# Patient Record
Sex: Male | Born: 1990 | Race: Black or African American | Hispanic: No | Marital: Married | Smoking: Never smoker
Health system: Southern US, Community
[De-identification: ages and names within clinical notes are randomized; demographics above are authoritative.]

## PROBLEM LIST (undated history)

## (undated) DIAGNOSIS — J45909 Unspecified asthma, uncomplicated: Secondary | ICD-10-CM

---
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
## 2013-09-17 ENCOUNTER — Encounter (HOSPITAL_BASED_OUTPATIENT_CLINIC_OR_DEPARTMENT_OTHER): Payer: Self-pay | Admitting: Emergency Medicine

## 2013-09-17 ENCOUNTER — Emergency Department (HOSPITAL_BASED_OUTPATIENT_CLINIC_OR_DEPARTMENT_OTHER)
Admission: EM | Admit: 2013-09-17 | Discharge: 2013-09-17 | Disposition: A | Discharge status: Home / Self Care | Payer: BC Managed Care – PPO | Attending: Emergency Medicine | Admitting: Emergency Medicine

## 2013-09-17 DIAGNOSIS — S8990XA Unspecified injury of unspecified lower leg, initial encounter: Secondary | ICD-10-CM | POA: Insufficient documentation

## 2013-09-17 DIAGNOSIS — S99919A Unspecified injury of unspecified ankle, initial encounter: Principal | ICD-10-CM

## 2013-09-17 DIAGNOSIS — Y92410 Unspecified street and highway as the place of occurrence of the external cause: Secondary | ICD-10-CM | POA: Insufficient documentation

## 2013-09-17 DIAGNOSIS — S99929A Unspecified injury of unspecified foot, initial encounter: Principal | ICD-10-CM

## 2013-09-17 DIAGNOSIS — M25561 Pain in right knee: Secondary | ICD-10-CM

## 2013-09-17 DIAGNOSIS — Y9389 Activity, other specified: Secondary | ICD-10-CM | POA: Insufficient documentation

## 2013-09-17 NOTE — Discharge Instructions (Signed)
Activity as tolerated. Return sooner to your orthopedic doctor or the emergency room if you are unable to bear weight in your right leg your right knee locks in place or has give way weakness or you develop new weakness or numbness or right leg swelling or pain to the right calf or thigh or other concerns. You can try over-the-counter generic Tylenol or ibuprofen if needed.
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
## 2013-09-20 ENCOUNTER — Ambulatory Visit (INDEPENDENT_AMBULATORY_CARE_PROVIDER_SITE_OTHER): Payer: BC Managed Care – PPO

## 2013-09-20 ENCOUNTER — Encounter: Payer: Self-pay | Admitting: Sports Medicine

## 2013-09-20 ENCOUNTER — Ambulatory Visit (INDEPENDENT_AMBULATORY_CARE_PROVIDER_SITE_OTHER): Payer: BC Managed Care – PPO | Admitting: Sports Medicine

## 2013-09-20 VITALS — BP 128/82 | HR 60 | Ht 70.0 in | Wt 150.0 lb

## 2013-09-20 DIAGNOSIS — M25569 Pain in unspecified knee: Secondary | ICD-10-CM

## 2013-09-20 DIAGNOSIS — M25561 Pain in right knee: Secondary | ICD-10-CM | POA: Insufficient documentation

## 2013-09-20 MED ORDER — MELOXICAM 15 MG PO TABS: ORAL_TABLET | ORAL | Status: DC

## 2013-09-20 NOTE — Assessment & Plan Note (Signed)
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 We will start conservatively with formal PT, Mobic, patellar stabilizing brace. Return to see me in one month, consider a shot if no better.
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
## 2013-09-26 ENCOUNTER — Ambulatory Visit: Payer: BC Managed Care – PPO | Attending: Sports Medicine | Admitting: Physical Therapy

## 2013-10-03 ENCOUNTER — Encounter: Payer: BC Managed Care – PPO | Admitting: Rehabilitation

## 2013-10-06 ENCOUNTER — Encounter: Payer: BC Managed Care – PPO | Admitting: Physical Therapy

## 2013-10-18 ENCOUNTER — Ambulatory Visit: Payer: BC Managed Care – PPO | Admitting: Sports Medicine

## 2013-10-18 DIAGNOSIS — Z0289 Encounter for other administrative examinations: Secondary | ICD-10-CM

## 2013-11-12 ENCOUNTER — Emergency Department (HOSPITAL_BASED_OUTPATIENT_CLINIC_OR_DEPARTMENT_OTHER): Payer: BC Managed Care – PPO

## 2013-11-12 ENCOUNTER — Encounter (HOSPITAL_BASED_OUTPATIENT_CLINIC_OR_DEPARTMENT_OTHER): Payer: Self-pay | Admitting: Emergency Medicine

## 2013-11-12 ENCOUNTER — Emergency Department (HOSPITAL_BASED_OUTPATIENT_CLINIC_OR_DEPARTMENT_OTHER)
Admission: EM | Admit: 2013-11-12 | Discharge: 2013-11-12 | Disposition: A | Discharge status: Home / Self Care | Payer: BC Managed Care – PPO | Attending: Emergency Medicine | Admitting: Emergency Medicine

## 2013-11-12 DIAGNOSIS — Y92838 Other recreation area as the place of occurrence of the external cause: Secondary | ICD-10-CM

## 2013-11-12 DIAGNOSIS — S93609A Unspecified sprain of unspecified foot, initial encounter: Secondary | ICD-10-CM | POA: Insufficient documentation

## 2013-11-12 DIAGNOSIS — Y9239 Other specified sports and athletic area as the place of occurrence of the external cause: Secondary | ICD-10-CM | POA: Insufficient documentation

## 2013-11-12 DIAGNOSIS — Z79899 Other long term (current) drug therapy: Secondary | ICD-10-CM | POA: Insufficient documentation

## 2013-11-12 DIAGNOSIS — X500XXA Overexertion from strenuous movement or load, initial encounter: Secondary | ICD-10-CM | POA: Insufficient documentation

## 2013-11-12 DIAGNOSIS — S93501A Unspecified sprain of right great toe, initial encounter: Secondary | ICD-10-CM

## 2013-11-12 DIAGNOSIS — Y9367 Activity, basketball: Secondary | ICD-10-CM | POA: Insufficient documentation

## 2013-11-12 MED ORDER — NAPROXEN 500 MG PO TABS: 500.0000 mg | ORAL_TABLET | Freq: Two times a day (BID) | ORAL | Status: DC

## 2013-11-12 MED ORDER — HYDROCODONE-ACETAMINOPHEN 5-325 MG PO TABS: 2.0000 | ORAL_TABLET | ORAL | Status: DC | PRN

## 2013-11-12 NOTE — ED Provider Notes (Signed)
CSN: [id number]     Arrival date & time 11/12/13  2025 History   First MD Initiated Contact with Patient 11/12/13 2156     Chief Complaint  Patient presents with  . Toe Injury     (Consider location/radiation/quality/duration/timing/severity/associated sxs/prior Treatment) HPI Hector Mccann is a 23 y.o. male who presents to the ED with pain in his right great toe. He was playing basketball earlier and and came down on his toe and felt a pop. He complains of pain and swelling. The pain is moderate he has taken nothing for pain.   History reviewed. No pertinent past medical history. History reviewed. No pertinent past surgical history. No family history on file. History  Substance Use Topics  . Smoking status: Never Smoker   . Smokeless tobacco: Not on file  . Alcohol Use: No    Review of Systems Negative except as stated in HPI   Allergies  Review of patient's allergies indicates no known allergies.  Home Medications   Current Outpatient Rx  Name  Route  Sig  Dispense  Refill  . meloxicam (MOBIC) 15 MG tablet      One tab PO qAM with breakfast for 2 weeks, then daily prn pain.   30 tablet   3    BP 130/81  Temp(Src) 97.5 F (36.4 C) (Oral)  Resp 18  SpO2 100% Physical Exam  Nursing note and vitals reviewed. Constitutional: He is oriented to person, place, and time. He appears well-developed and well-nourished. No distress.  HENT:  Head: Normocephalic.  Eyes: EOM are normal.  Neck: Neck supple.  Pulmonary/Chest: Effort normal.  Abdominal: There is tenderness.  Musculoskeletal: Normal range of motion.       Right foot: He exhibits tenderness. He exhibits normal range of motion, no deformity and no laceration. Swelling: minimal.       Feet:  Tender with palpation and range of motion of the right great toe. Pedal pulse strong, adequate circulation, good touch sensation.  Neurological: He is alert and oriented to person, place, and time. No cranial nerve  deficit.  Skin: Skin is warm and dry.  Psychiatric: He has a normal mood and affect. His behavior is normal.    ED Course  Procedures (including critical care time) Labs Review Labs Reviewed - No data to display Imaging Review Dg Toe Great Right  11/12/2013   CLINICAL DATA:  Basketball injury. Fall great toe pop. Now with pain and swelling.  EXAM: RIGHT GREAT TOE  COMPARISON:  None.  FINDINGS: No fracture or dislocation. Small marginal spurs project from the first metatarsal head. IP joint is well preserved. Soft tissues are unremarkable.  IMPRESSION: No fracture or acute finding. Minor marginal spurring at the first metatarsophalangeal joint.   Electronically Signed   By: Amie Portlandavid  Ormond M.D.   On: 11/12/2013 21:25     MDM  23 y.o. male with right toe pain s/p injury. Buddy tape, post op shoe, ice, elevate, return as needed. Stable for discharge without neurovascular compromise.  Discussed with the patient and all questioned fully answered. He will return if any problems arise.    Medication List    STOP taking these medications       meloxicam 15 MG tablet  Commonly known as:  MOBIC      TAKE these medications       HYDROcodone-acetaminophen 5-325 MG per tablet  Commonly known as:  NORCO/VICODIN  Take 2 tablets by mouth every 4 (four) hours as needed.  naproxen 500 MG tablet  Commonly known as:  NAPROSYN  Take 1 tablet (500 mg total) by mouth 2 (two) times daily.           [place], [place] 11/13/13 [phone]

## 2013-11-12 NOTE — ED Notes (Signed)
Was playing basketball and felt his right great toe "pop" and now c/o pain and swelling.

## 2013-11-14 NOTE — ED Notes (Signed)
Pt presented to ED for a return to work note.

## 2013-11-15 NOTE — ED Provider Notes (Signed)
Medical screening examination/treatment/procedure(s) were performed by non-physician practitioner and as supervising physician I was immediately available for consultation/collaboration.   EKG Interpretation None        Shelda JakesScott W. Silviano Neuser, MD 11/15/13 [phone]

## 2014-11-29 ENCOUNTER — Emergency Department (HOSPITAL_COMMUNITY): Payer: Self-pay

## 2014-11-29 ENCOUNTER — Emergency Department (HOSPITAL_COMMUNITY)
Admission: EM | Admit: 2014-11-29 | Discharge: 2014-11-29 | Discharge status: Against Medical Advice | Payer: Self-pay | Attending: Emergency Medicine | Admitting: Emergency Medicine

## 2014-11-29 ENCOUNTER — Encounter (HOSPITAL_COMMUNITY): Payer: Self-pay | Admitting: Emergency Medicine

## 2014-11-29 DIAGNOSIS — R0603 Acute respiratory distress: Secondary | ICD-10-CM

## 2014-11-29 DIAGNOSIS — Z791 Long term (current) use of non-steroidal anti-inflammatories (NSAID): Secondary | ICD-10-CM | POA: Insufficient documentation

## 2014-11-29 DIAGNOSIS — J988 Other specified respiratory disorders: Secondary | ICD-10-CM | POA: Insufficient documentation

## 2014-11-29 DIAGNOSIS — J45901 Unspecified asthma with (acute) exacerbation: Secondary | ICD-10-CM

## 2014-11-29 HISTORY — DX: Unspecified asthma, uncomplicated: J45.909

## 2014-11-29 LAB — BASIC METABOLIC PANEL
Anion gap: 11 (ref 5–15)
BUN: 10 mg/dL (ref 6–23)
CALCIUM: 9.8 mg/dL (ref 8.4–10.5)
CO2: 28 mmol/L (ref 19–32)
Chloride: 101 mmol/L (ref 96–112)
Creatinine, Ser: 1.15 mg/dL (ref 0.50–1.35)
GFR calc non Af Amer: 88 mL/min — ABNORMAL LOW (ref >90)
Glucose, Bld: 93 mg/dL (ref 70–99)
POTASSIUM: 3.6 mmol/L (ref 3.5–5.1)
SODIUM: 140 mmol/L (ref 135–145)

## 2014-11-29 LAB — CBC WITH DIFFERENTIAL/PLATELET
Basophils Absolute: 0 10*3/uL (ref 0.0–0.1)
Basophils Relative: 0 % (ref 0–1)
Eosinophils Absolute: 0.5 10*3/uL (ref 0.0–0.7)
Eosinophils Relative: 5 % (ref 0–5)
HCT: 46.2 % (ref 39.0–52.0)
Hemoglobin: 15.7 g/dL (ref 13.0–17.0)
LYMPHS ABS: 2.4 10*3/uL (ref 0.7–4.0)
LYMPHS PCT: 25 % (ref 12–46)
MCH: 31.1 pg (ref 26.0–34.0)
MCHC: 34 g/dL (ref 30.0–36.0)
MCV: 91.5 fL (ref 78.0–100.0)
MONO ABS: 0.7 10*3/uL (ref 0.1–1.0)
Monocytes Relative: 7 % (ref 3–12)
NEUTROS ABS: 5.8 10*3/uL (ref 1.7–7.7)
NEUTROS PCT: 63 % (ref 43–77)
PLATELETS: 236 10*3/uL (ref 150–400)
RBC: 5.05 MIL/uL (ref 4.22–5.81)
RDW: 11.7 % (ref 11.5–15.5)
WBC: 9.4 10*3/uL (ref 4.0–10.5)

## 2014-11-29 MED ORDER — SODIUM CHLORIDE 0.9 % IV BOLUS (SEPSIS)
1000.0000 mL | Freq: Once | INTRAVENOUS | Status: AC
  Administered 2014-11-29: 1000 mL via INTRAVENOUS

## 2014-11-29 MED ORDER — MAGNESIUM SULFATE 2 GM/50ML IV SOLN
2.0000 g | Freq: Once | INTRAVENOUS | Status: AC
  Administered 2014-11-29: 2 g via INTRAVENOUS
  Filled 2014-11-29: qty 50

## 2014-11-29 MED ORDER — IPRATROPIUM-ALBUTEROL 0.5-2.5 (3) MG/3ML IN SOLN
RESPIRATORY_TRACT | Status: AC
  Filled 2014-11-29: qty 3

## 2014-11-29 MED ORDER — ALBUTEROL (5 MG/ML) CONTINUOUS INHALATION SOLN
10.0000 mg/h | INHALATION_SOLUTION | RESPIRATORY_TRACT | Status: DC
  Administered 2014-11-29: 10 mg/h via RESPIRATORY_TRACT
  Filled 2014-11-29: qty 20

## 2014-11-29 MED ORDER — PREDNISONE 20 MG PO TABS: 60.0000 mg | ORAL_TABLET | Freq: Every day | ORAL | Status: DC

## 2014-11-29 MED ORDER — PREDNISONE 20 MG PO TABS
60.0000 mg | ORAL_TABLET | Freq: Once | ORAL | Status: AC
  Administered 2014-11-29: 60 mg via ORAL
  Filled 2014-11-29: qty 3

## 2014-11-29 MED ORDER — IPRATROPIUM BROMIDE 0.02 % IN SOLN
1.0000 mg | Freq: Once | RESPIRATORY_TRACT | Status: AC
  Administered 2014-11-29: 1 mg via RESPIRATORY_TRACT
  Filled 2014-11-29: qty 5

## 2014-11-29 MED ORDER — IPRATROPIUM-ALBUTEROL 0.5-2.5 (3) MG/3ML IN SOLN
3.0000 mL | Freq: Once | RESPIRATORY_TRACT | Status: AC
  Administered 2014-11-29: 3 mL via RESPIRATORY_TRACT

## 2014-11-29 MED ORDER — ALBUTEROL SULFATE HFA 108 (90 BASE) MCG/ACT IN AERS
2.0000 | INHALATION_SPRAY | Freq: Once | RESPIRATORY_TRACT | Status: AC
  Administered 2014-11-29: 2 via RESPIRATORY_TRACT
  Filled 2014-11-29: qty 6.7

## 2014-11-29 NOTE — ED Notes (Signed)
Patient asking why his heart is racing.  Explained to him that the breathing treatment had a steroid in it and that can cause your heart to race.

## 2014-11-29 NOTE — ED Notes (Signed)
Phlebotomy at the bedside  

## 2014-11-29 NOTE — ED Notes (Signed)
Patient with acute asthma exacerbation starting today. States life long history, but doesn't usually have trouble with it. Denies inhaler use, states "I lost them". Wheezing audible from bedside.

## 2014-11-29 NOTE — Discharge Instructions (Signed)
Asthma Attack Prevention Hector Mccann, you have decided to leave against medical advice while your oxygen level is low.  Come back to the ED immediately to have your treatment finished.  Use your inhaler every 4 hours for the next 2 days.  Follow up with a primary doctor within 3 days for care of your asthma.  Thank you. Although there is no way to prevent asthma from starting, you can take steps to control the disease and reduce its symptoms. Learn about your asthma and how to control it. Take an active role to control your asthma by working with your health care provider to create and follow an asthma action plan. An asthma action plan guides you in:  Taking your medicines properly.  Avoiding things that set off your asthma or make your asthma worse (asthma triggers).  Tracking your level of asthma control.  Responding to worsening asthma.  Seeking emergency care when needed. To track your asthma, keep records of your symptoms, check your peak flow number using a handheld device that shows how well air moves out of your lungs (peak flow meter), and get regular asthma checkups.  WHAT ARE SOME WAYS TO PREVENT AN ASTHMA ATTACK?  Take medicines as directed by your health care provider.  Keep track of your asthma symptoms and level of control.  With your health care provider, write a detailed plan for taking medicines and managing an asthma attack. Then be sure to follow your action plan. Asthma is an ongoing condition that needs regular monitoring and treatment.  Identify and avoid asthma triggers. Many outdoor allergens and irritants (such as pollen, mold, cold air, and air pollution) can trigger asthma attacks. Find out what your asthma triggers are and take steps to avoid them.  Monitor your breathing. Learn to recognize warning signs of an attack, such as coughing, wheezing, or shortness of breath. Your lung function may decrease before you notice any signs or symptoms, so regularly measure  and record your peak airflow with a home peak flow meter.  Identify and treat attacks early. If you act quickly, you are less likely to have a severe attack. You will also need less medicine to control your symptoms. When your peak flow measurements decrease and alert you to an upcoming attack, take your medicine as instructed and immediately stop any activity that may have triggered the attack. If your symptoms do not improve, get medical help.  Pay attention to increasing quick-relief inhaler use. If you find yourself relying on your quick-relief inhaler, your asthma is not under control. See your health care provider about adjusting your treatment. WHAT CAN MAKE MY SYMPTOMS WORSE? A number of common things can set off or make your asthma symptoms worse and cause temporary increased inflammation of your airways. Keep track of your asthma symptoms for several weeks, detailing all the environmental and emotional factors that are linked with your asthma. When you have an asthma attack, go back to your asthma diary to see which factor, or combination of factors, might have contributed to it. Once you know what these factors are, you can take steps to control many of them. If you have allergies and asthma, it is important to take asthma prevention steps at home. Minimizing contact with the substance to which you are allergic will help prevent an asthma attack. Some triggers and ways to avoid these triggers are: Animal Dander:  Some people are allergic to the flakes of skin or dried saliva from animals with fur or feathers.  There is no such thing as a hypoallergenic dog or cat breed. All dogs or cats can cause allergies, even if they don't shed.  Keep these pets out of your home.  If you are not able to keep a pet outdoors, keep the pet out of your bedroom and other sleeping areas at all times, and keep the door closed.  Remove carpets and furniture covered with cloth from your home. If that is not  possible, keep the pet away from fabric-covered furniture and carpets. Dust Mites: Many people with asthma are allergic to dust mites. Dust mites are tiny bugs that are found in every home in mattresses, pillows, carpets, fabric-covered furniture, bedcovers, clothes, stuffed toys, and other fabric-covered items.   Cover your mattress in a special dust-proof cover.  Cover your pillow in a special dust-proof cover, or wash the pillow each week in hot water. Water must be hotter than 130 F (54.4 C) to kill dust mites. Cold or warm water used with detergent and bleach can also be effective.  Wash the sheets and blankets on your bed each week in hot water.  Try not to sleep or lie on cloth-covered cushions.  Call ahead when traveling and ask for a smoke-free hotel room. Bring your own bedding and pillows in case the hotel only supplies feather pillows and down comforters, which may contain dust mites and cause asthma symptoms.  Remove carpets from your bedroom and those laid on concrete, if you can.  Keep stuffed toys out of the bed, or wash the toys weekly in hot water or cooler water with detergent and bleach. Cockroaches: Many people with asthma are allergic to the droppings and remains of cockroaches.   Keep food and garbage in closed containers. Never leave food out.  Use poison baits, traps, powders, gels, or paste (for example, boric acid).  If a spray is used to kill cockroaches, stay out of the room until the odor goes away. Indoor Mold:  Fix leaky faucets, pipes, or other sources of water that have mold around them.  Clean floors and moldy surfaces with a fungicide or diluted bleach.  Avoid using humidifiers, vaporizers, or swamp coolers. These can spread molds through the air. Pollen and Outdoor Mold:  When pollen or mold spore counts are high, try to keep your windows closed.  Stay indoors with windows closed from late morning to afternoon. Pollen and some mold spore  counts are highest at that time.  Ask your health care provider whether you need to take anti-inflammatory medicine or increase your dose of the medicine before your allergy season starts. Other Irritants to Avoid:  Tobacco smoke is an irritant. If you smoke, ask your health care provider how you can quit. Ask family members to quit smoking, too. Do not allow smoking in your home or car.  If possible, do not use a wood-burning stove, kerosene heater, or fireplace. Minimize exposure to all sources of smoke, including incense, candles, fires, and fireworks.  Try to stay away from strong odors and sprays, such as perfume, talcum powder, hair spray, and paints.  Decrease humidity in your home and use an indoor air cleaning device. Reduce indoor humidity to below 60%. Dehumidifiers or central air conditioners can do this.  Decrease house dust exposure by changing furnace and air cooler filters frequently.  Try to have someone else vacuum for you once or twice a week. Stay out of rooms while they are being vacuumed and for a short while afterward.  If you vacuum, use a dust mask from a hardware store, a double-layered or microfilter vacuum cleaner bag, or a vacuum cleaner with a HEPA filter.  Sulfites in foods and beverages can be irritants. Do not drink beer or wine or eat dried fruit, processed potatoes, or shrimp if they cause asthma symptoms.  Cold air can trigger an asthma attack. Cover your nose and mouth with a scarf on cold or windy days.  Several health conditions can make asthma more difficult to manage, including a runny nose, sinus infections, reflux disease, psychological stress, and sleep apnea. Work with your health care provider to manage these conditions.  Avoid close contact with people who have a respiratory infection such as a cold or the flu, since your asthma symptoms may get worse if you catch the infection. Wash your hands thoroughly after touching items that may have been  handled by people with a respiratory infection.  Get a flu shot every year to protect against the flu virus, which often makes asthma worse for days or weeks. Also get a pneumonia shot if you have not previously had one. Unlike the flu shot, the pneumonia shot does not need to be given yearly. Medicines:  Talk to your health care provider about whether it is safe for you to take aspirin or non-steroidal anti-inflammatory medicines (NSAIDs). In a small number of people with asthma, aspirin and NSAIDs can cause asthma attacks. These medicines must be avoided by people who have known aspirin-sensitive asthma. It is important that people with aspirin-sensitive asthma read labels of all over-the-counter medicines used to treat pain, colds, coughs, and fever.  Beta-blockers and ACE inhibitors are other medicines you should discuss with your health care provider. HOW CAN I FIND OUT WHAT I AM ALLERGIC TO? Ask your asthma health care provider about allergy skin testing or blood testing (the RAST test) to identify the allergens to which you are sensitive. If you are found to have allergies, the most important thing to do is to try to avoid exposure to any allergens that you are sensitive to as much as possible. Other treatments for allergies, such as medicines and allergy shots (immunotherapy) are available.  CAN I EXERCISE? Follow your health care provider's advice regarding asthma treatment before exercising. It is important to maintain a regular exercise program, but vigorous exercise or exercise in cold, humid, or dry environments can cause asthma attacks, especially for those people who have exercise-induced asthma. Document Released: 08/12/2009 Document Revised: 08/29/2013 Document Reviewed: 03/01/2013 [facility] Patient Information 2015 [place], [place]. This information is not intended to replace advice given to you by your health care provider. Make sure you discuss any questions you have with your health  care provider.

## 2014-11-29 NOTE — ED Notes (Signed)
Pt states that he does not want to stay in the hospital, aware of risk and benefits, wheezing noted in right lower lobe, pt aware he will be leaving against medical advice.

## 2014-11-29 NOTE — ED Notes (Signed)
Pulse ox runs 93-98% on room air  Pulse runs 107-131bpm

## 2014-11-29 NOTE — ED Provider Notes (Signed)
CSN: [id number]     Arrival date & time 11/29/14  0121 History   First MD Initiated Contact with Patient 11/29/14 0126     This chart was scribed for Hector CrumbleAdeleke Jameika Kinn, MD by Arlan OrganAshley Leger, ED Scribe. This patient was seen in room B18C/B18C and the patient's care was started 1:48 AM.   Chief Complaint  Patient presents with  . Asthma  . Wheezing   HPI  HPI Comments: Hector Mccann is a 24 y.o. male with a PMHx of Asthma who presents to the Emergency Department complaining of a possible asthma exacerbation onset today. Pt reports constant, ongoing, unchanged SOB, non productive cough, wheezing, and subjective fever. No prior treatments prior to arrival as pt states he lost his rescue inhaler some time ago. [name] denies any recent asthma attacks and is unaware of what may have triggered todays flare up. No known allergies to medications.   Past Medical History  Diagnosis Date  . Asthma    History reviewed. No pertinent past surgical history. History reviewed. No pertinent family history. History  Substance Use Topics  . Smoking status: Never Smoker   . Smokeless tobacco: Not on file  . Alcohol Use: No    Review of Systems  A complete 10 system review of systems was obtained and all systems are negative except as noted in the HPI and PMH.    Allergies  Review of patient's allergies indicates no known allergies.  Home Medications   Prior to Admission medications   Medication Sig Start Date End Date Taking? Authorizing Provider  HYDROcodone-acetaminophen (NORCO/VICODIN) 5-325 MG per tablet Take 2 tablets by mouth every 4 (four) hours as needed. 11/12/13   Hope Orlene OchM Neese, NP  naproxen (NAPROSYN) 500 MG tablet Take 1 tablet (500 mg total) by mouth 2 (two) times daily. 11/12/13   Hope Orlene OchM Neese, NP   Triage Vitals: BP 142/87 mmHg  Pulse 73  Temp(Src) 97.5 F (36.4 C) (Oral)  Resp 26  Ht 5\' 10"  (1.778 m)  Wt 150 lb (68.04 kg)  BMI 21.52 kg/m2  SpO2 92%   Physical Exam   Constitutional: He is oriented to person, place, and time. He appears well-developed and well-nourished.  HENT:  Head: Normocephalic and atraumatic.  Eyes: EOM are normal.  Neck: Normal range of motion.  Cardiovascular: Normal rate, regular rhythm, normal heart sounds and intact distal pulses.   Pulmonary/Chest: No respiratory distress. He has wheezes.  Increase work of breathing   Abdominal: Soft. He exhibits no distension. There is no tenderness.  Musculoskeletal: Normal range of motion.  Neurological: He is alert and oriented to person, place, and time.  Skin: Skin is warm and dry.  Psychiatric: He has a normal mood and affect. Judgment normal.  Nursing note and vitals reviewed.   ED Course  Procedures (including critical care time)  DIAGNOSTIC STUDIES: Oxygen Saturation is 92% on RA, low by my interpretation.    COORDINATION OF CARE: 1:50 AM-Discussed treatment plan with pt at bedside and pt agreed to plan.     Labs Review Labs Reviewed - No data to display  Imaging Review No results found.   EKG Interpretation None      MDM   Final diagnoses:  None   Patient presents for asthma exacerbation.  Patient given alb/ipratropium, prednisone, magnesium and IVF.  After treatment he continued to have O2 sat of 90% on RA. With wheezing.  Patient was advised to be admitted but he is refusing.  He is leaving  AMA.  Patient has decision making capacity at this time.  He will be DC home without completing his evaluation.    CRITICAL CARE Performed by: Hector Crumble   Total critical care time: resp distress  Critical care time was exclusive of separately billable procedures and treating other patients.  Critical care was necessary to treat or prevent imminent or life-threatening deterioration.  Critical care was time spent personally by me on the following activities: development of treatment plan with patient and/or surrogate as well as nursing, discussions with  consultants, evaluation of patient's response to treatment, examination of patient, obtaining history from patient or surrogate, ordering and performing treatments and interventions, ordering and review of laboratory studies, ordering and review of radiographic studies, pulse oximetry and re-evaluation of patient's condition.   I personally performed the services described in this documentation, which was scribed in my presence. The recorded information has been reviewed and is accurate.   Hector Crumble, MD 11/29/14 [phone]

## 2014-11-29 NOTE — ED Notes (Signed)
Dr. Oni at the bedside.  

## 2015-05-11 IMAGING — DX DG CHEST 2V
2 series · 2 of 2 positions shown · non-contrast
Comparison: None.

CLINICAL DATA: Acute onset of shortness of breath. Initial
encounter.

EXAM:
CHEST  2 VIEW

[chest pa]
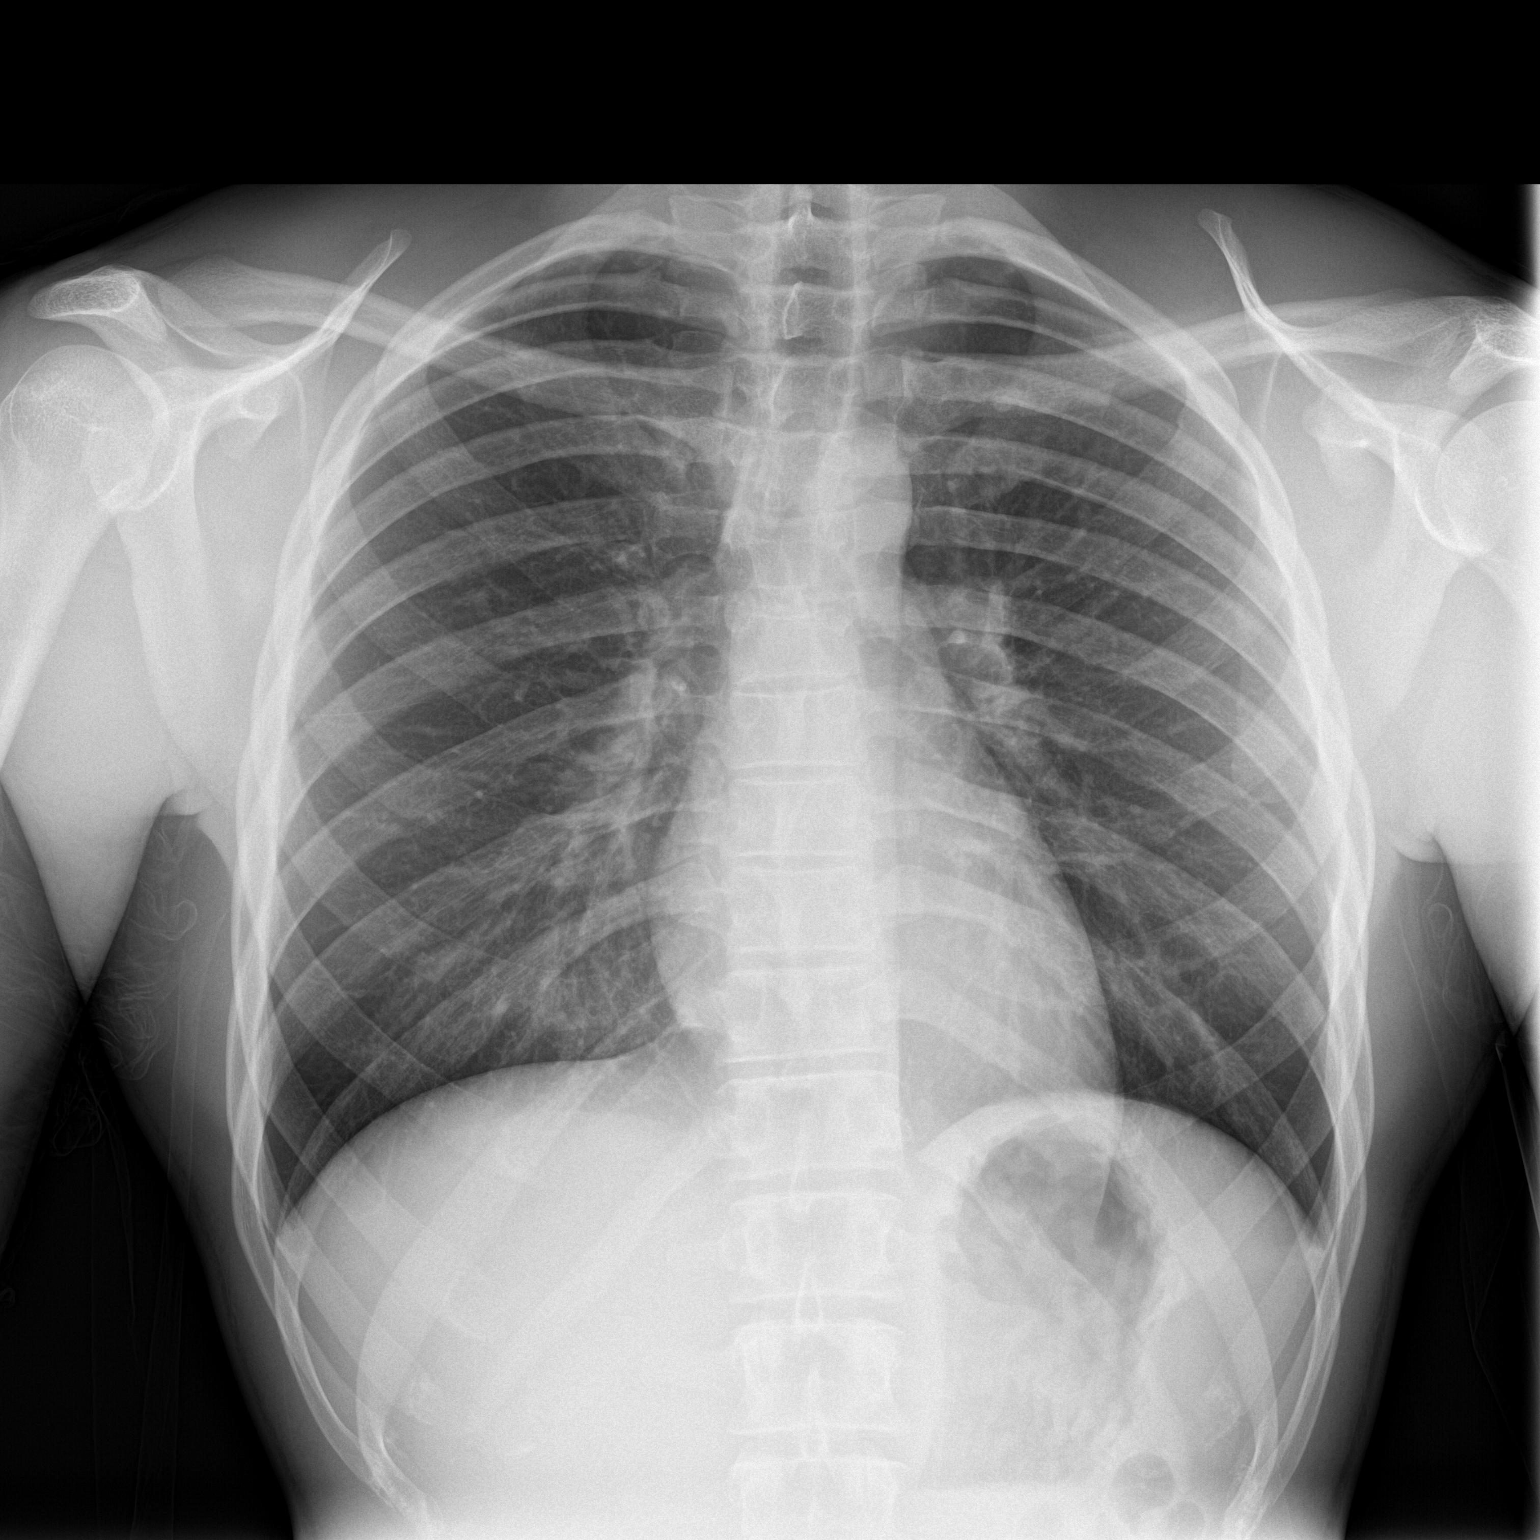

[chest lat]
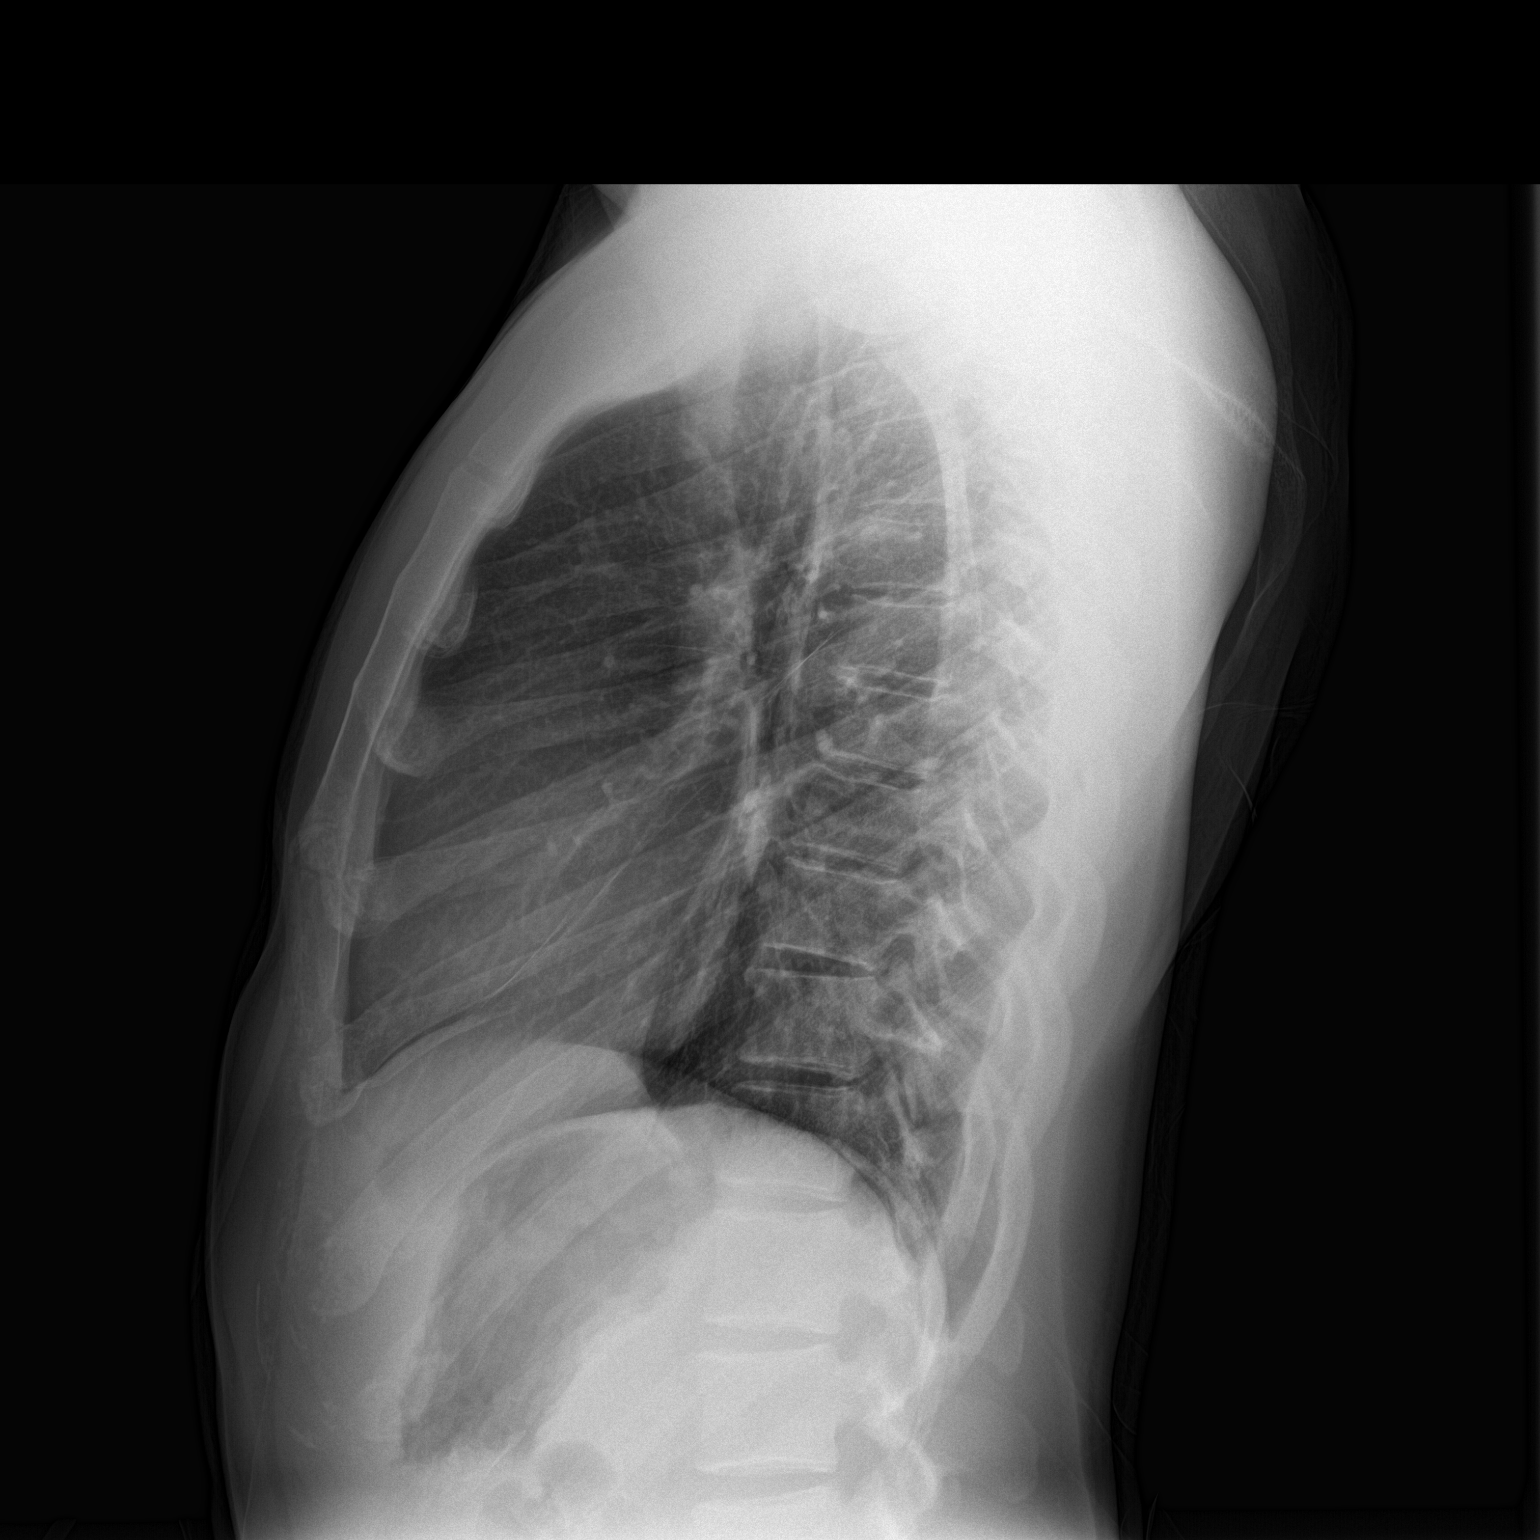

[2 of 2 positions shown; findings below may reference images not displayed]

FINDINGS: The lungs are well-aerated. Mild chronic peribronchial thickening is
noted. There is no evidence of focal opacification, pleural effusion
or pneumothorax.

The heart is normal in size; the mediastinal contour is within
normal limits. No acute osseous abnormalities are seen.
IMPRESSION: Mild chronic peribronchial thickening noted, likely corresponding to
the patient's asthma; lungs otherwise clear.

## 2015-09-02 ENCOUNTER — Emergency Department (HOSPITAL_COMMUNITY)
Admission: EM | Admit: 2015-09-02 | Discharge: 2015-09-02 | Disposition: A | Discharge status: Home / Self Care | Payer: Self-pay | Attending: Emergency Medicine | Admitting: Emergency Medicine

## 2015-09-02 ENCOUNTER — Encounter (HOSPITAL_COMMUNITY): Payer: Self-pay | Admitting: Emergency Medicine

## 2015-09-02 DIAGNOSIS — R112 Nausea with vomiting, unspecified: Secondary | ICD-10-CM | POA: Insufficient documentation

## 2015-09-02 DIAGNOSIS — R0981 Nasal congestion: Secondary | ICD-10-CM | POA: Insufficient documentation

## 2015-09-02 DIAGNOSIS — H6123 Impacted cerumen, bilateral: Secondary | ICD-10-CM | POA: Insufficient documentation

## 2015-09-02 DIAGNOSIS — J45901 Unspecified asthma with (acute) exacerbation: Secondary | ICD-10-CM | POA: Insufficient documentation

## 2015-09-02 DIAGNOSIS — Z7952 Long term (current) use of systemic steroids: Secondary | ICD-10-CM | POA: Insufficient documentation

## 2015-09-02 DIAGNOSIS — R0602 Shortness of breath: Secondary | ICD-10-CM

## 2015-09-02 DIAGNOSIS — R509 Fever, unspecified: Secondary | ICD-10-CM | POA: Insufficient documentation

## 2015-09-02 DIAGNOSIS — R51 Headache: Secondary | ICD-10-CM | POA: Insufficient documentation

## 2015-09-02 MED ORDER — IPRATROPIUM-ALBUTEROL 0.5-2.5 (3) MG/3ML IN SOLN
3.0000 mL | Freq: Once | RESPIRATORY_TRACT | Status: AC
  Administered 2015-09-02: 3 mL via RESPIRATORY_TRACT

## 2015-09-02 MED ORDER — PREDNISONE 20 MG PO TABS: 60.0000 mg | ORAL_TABLET | Freq: Every day | ORAL | Status: DC

## 2015-09-02 MED ORDER — IPRATROPIUM-ALBUTEROL 0.5-2.5 (3) MG/3ML IN SOLN
RESPIRATORY_TRACT | Status: AC
  Filled 2015-09-02: qty 3

## 2015-09-02 MED ORDER — PREDNISONE 20 MG PO TABS
60.0000 mg | ORAL_TABLET | Freq: Once | ORAL | Status: AC
  Administered 2015-09-02: 60 mg via ORAL
  Filled 2015-09-02: qty 3

## 2015-09-02 MED ORDER — IBUPROFEN 400 MG PO TABS
600.0000 mg | ORAL_TABLET | Freq: Once | ORAL | Status: AC
  Administered 2015-09-02: 600 mg via ORAL
  Filled 2015-09-02: qty 1

## 2015-09-02 MED ORDER — ALBUTEROL SULFATE HFA 108 (90 BASE) MCG/ACT IN AERS: 1.0000 | INHALATION_SPRAY | Freq: Four times a day (QID) | RESPIRATORY_TRACT | Status: DC | PRN

## 2015-09-02 NOTE — ED Notes (Signed)
Pt reports asthma flare since yesterday and he does not have an inhaler at this time.

## 2015-09-02 NOTE — ED Provider Notes (Signed)
CSN: [id number]     Arrival date & time 09/02/15  1534 History   First MD Initiated Contact with Patient 09/02/15 1606     Chief Complaint  Patient presents with  . Asthma   HPI   Hector Mccann is a 24 y.o. M PMH significant for asthma presenting with shortness of breath since Friday. He endorses subjective fevers, chills, nausea, one episode of vomiting (NBNB), nasal congestion, frontal headache (10/10 pain scale, non-radiating, pressure, constant). He denies CP, abdominal pain, visual changes, AMS, neck stiffness, alleviating factors.   Past Medical History  Diagnosis Date  . Asthma    History reviewed. No pertinent past surgical history. No family history on file. Social History  Substance Use Topics  . Smoking status: Never Smoker   . Smokeless tobacco: None  . Alcohol Use: No    Review of Systems  Ten systems are reviewed and are negative for acute change except as noted in the HPI  Allergies  Review of patient's allergies indicates no known allergies.  Home Medications   Prior to Admission medications   Medication Sig Start Date End Date Taking? Authorizing Provider  naproxen (NAPROSYN) 500 MG tablet Take 1 tablet (500 mg total) by mouth 2 (two) times daily. Patient not taking: Reported on 11/29/2014 11/12/13   Janne NapoleonHope M Neese, NP  predniSONE (DELTASONE) 20 MG tablet Take 3 tablets (60 mg total) by mouth daily. 11/29/14   Tomasita CrumbleAdeleke Oni, MD   BP 142/88 mmHg  Pulse 66  Temp(Src) 98 F (36.7 C) (Oral)  Resp 18  Ht 5\' 10"  (1.778 m)  Wt 69.945 kg  BMI 22.13 kg/m2  SpO2 98% Physical Exam  Constitutional: He appears well-developed and well-nourished. No distress.  HENT:  Head: Normocephalic and atraumatic.  Mouth/Throat: Oropharynx is clear and moist. No oropharyngeal exudate.  Cerumen impaction bilaterally. Swollen turbinates bilaterally.   Eyes: Conjunctivae are normal. Pupils are equal, round, and reactive to light. Right eye exhibits no discharge. Left eye exhibits  no discharge. No scleral icterus.  Neck: Normal range of motion. Neck supple. No tracheal deviation present.  Cardiovascular: Normal rate, regular rhythm, normal heart sounds and intact distal pulses.  Exam reveals no gallop and no friction rub.   No murmur heard. Pulmonary/Chest: Effort normal. No respiratory distress. He has wheezes. He has no rales. He exhibits no tenderness.  Minimal end expiratory wheezes  Abdominal: Soft. Bowel sounds are normal. He exhibits no distension and no mass. There is no tenderness. There is no rebound and no guarding.  Musculoskeletal: He exhibits no edema.  Lymphadenopathy:    He has no cervical adenopathy.  Neurological: He is alert. Coordination normal.  Skin: Skin is warm and dry. No rash noted. He is not diaphoretic. No erythema.  Psychiatric: He has a normal mood and affect. His behavior is normal.  Nursing note and vitals reviewed.   ED Course  Procedures  MDM   Final diagnoses:  Shortness of breath   Patient non-toxic appearing and VSS. Patient refused toradol shot, will give ibuprofen. Patient's wheezing improved with duoneb and prednisone. Patient may be safely discharged home with prednisone and inhaler. Discussed reasons for return. Patient to follow-up with primary care provider within one week. Patient in understanding and agreement with the plan.   Melton KrebsSamantha Nicole Jammal Sarr, PA-C 09/15/15 1447  Leta BaptistEmily Roe Nguyen, MD 09/22/15 [phone]

## 2015-09-02 NOTE — Discharge Instructions (Signed)
Mr. Hector Mccann,  Nice meeting you! Please follow-up with your primary care provider. Return to the emergency department if you are unable to breathe, develop chest pain, or have worsening headache. Feel better soon!  S. Lane HackerNicole Meela Wareing, PA-C

## 2017-08-06 ENCOUNTER — Encounter: Payer: Self-pay | Admitting: Podiatry

## 2017-08-06 NOTE — Patient Instructions (Signed)

## 2017-08-13 ENCOUNTER — Ambulatory Visit: Payer: Self-pay | Admitting: Podiatry

## 2017-08-24 ENCOUNTER — Encounter: Payer: Self-pay | Admitting: Podiatry

## 2017-08-24 ENCOUNTER — Ambulatory Visit (INDEPENDENT_AMBULATORY_CARE_PROVIDER_SITE_OTHER): Payer: BLUE CROSS/BLUE SHIELD

## 2017-08-24 ENCOUNTER — Ambulatory Visit (INDEPENDENT_AMBULATORY_CARE_PROVIDER_SITE_OTHER): Payer: BLUE CROSS/BLUE SHIELD | Admitting: Podiatry

## 2017-08-24 VITALS — BP 140/89 | HR 74 | Resp 18

## 2017-08-24 DIAGNOSIS — M21619 Bunion of unspecified foot: Secondary | ICD-10-CM

## 2017-08-24 MED ORDER — MELOXICAM 15 MG PO TABS: 15.0000 mg | ORAL_TABLET | Freq: Every day | ORAL | 2 refills | Status: DC

## 2017-08-24 NOTE — Progress Notes (Signed)
Subjective:    Patient ID: Hector Mccann, male    DOB: 07/19/1991, 26 y.o.   MRN: [id number]  HPI  26 year old male presents the office today for concerns of a painful area to the right big toe he points on to the bunion site.  He states the area is very painful with pressure in shoes.  He does try to change shoes without any significant improvement he states the pain is occurring on a daily basis.  He does state that it got worse about 3 months ago he was playing basketball and he had excruciating pain to that area.  The pain did improve somewhat however has started to recur and is on a continual basis.  He will discussed treatment options for this point.  He has no other concerns.  Review of Systems  All other systems reviewed and are negative.  Past Medical History:  Diagnosis Date  . Asthma     History reviewed. No pertinent surgical history.   Current Outpatient Medications:  .  albuterol (PROVENTIL HFA;VENTOLIN HFA) 108 (90 BASE) MCG/ACT inhaler, Inhale 1-2 puffs into the lungs every 6 (six) hours as needed for wheezing or shortness of breath. (Patient not taking: Reported on 08/24/2017), Disp: 1 Inhaler, Rfl: 0 .  meloxicam (MOBIC) 15 MG tablet, Take 1 tablet (15 mg total) by mouth daily., Disp: 30 tablet, Rfl: 2 .  naproxen (NAPROSYN) 500 MG tablet, Take 1 tablet (500 mg total) by mouth 2 (two) times daily. (Patient not taking: Reported on 11/29/2014), Disp: 30 tablet, Rfl: 0 .  predniSONE (DELTASONE) 20 MG tablet, Take 3 tablets (60 mg total) by mouth daily. (Patient not taking: Reported on 08/24/2017), Disp: 15 tablet, Rfl: 0  No Known Allergies  Social History   Socioeconomic History  . Marital status: Married    Spouse name: Not on file  . Number of children: Not on file  . Years of education: Not on file  . Highest education level: Not on file  Social Needs  . Financial resource strain: Not on file  . Food insecurity - worry: Not on file  . Food insecurity -  inability: Not on file  . Transportation needs - medical: Not on file  . Transportation needs - non-medical: Not on file  Occupational History  . Not on file  Tobacco Use  . Smoking status: Never Smoker  . Smokeless tobacco: Never Used  Substance and Sexual Activity  . Alcohol use: No  . Drug use: Not on file  . Sexual activity: Not on file  Other Topics Concern  . Not on file  Social History Narrative  . Not on file        Objective:   Physical Exam  General: AAO x3, NAD  Dermatological: Skin is warm, dry and supple bilateral. Nails x 10 are well manicured; remaining integument appears unremarkable at this time. There are no open sores, no preulcerative lesions, no rash or signs of infection present.  Vascular: Dorsalis Pedis artery and Posterior Tibial artery pedal pulses are 2/4 bilateral with immedate capillary fill time. Pedal hair growth present. No varicosities and no lower extremity edema present bilateral. There is no pain with calf compression, swelling, warmth, erythema.   Neruologic: Grossly intact via light touch bilateral. Vibratory intact via tuning fork bilateral. Protective threshold with Semmes Wienstein monofilament intact to all pedal sites bilateral.   Musculoskeletal: Moderate HAV is present.  There is a large bony prominence of the first metatarsal on the bunion site.  There is tenderness palpation joint on the bunion there is mild erythema from where it has been running set of issues.  There is no first ray hypomobility present.  There is no pain or crepitation with first MPJ range of motion.  Also similar deformity to the left side but is not symptomatic at this time.  Muscular strength 5/5 in all groups tested bilateral.  Gait: Unassisted, Nonantalgic.     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 Moderate HAV is present.  There is no evidence of acute fracture identified. -We discussed both conservative as well as surgical treatment options.  We had a discussion regards to this he states it happens on everyday basis despite shoe gear change.  Discussed with him that given the significant deformity has an clinical exam and given his age I do not get better on its own.  He would like to proceed with surgery at this point.  We discussed surgical intervention and this is not a guarantee of resolution of symptoms.  He understands this and wishes to proceed. -Discussed [place] bunionectomy right foot -Long-term he understands any new orthotic and there is a chance of recurrence, arthritis and other issues. -The incision placement as well as the postoperative course was discussed with the patient. I discussed risks of the surgery which include, but not limited to, infection, bleeding, pain, swelling, need for further surgery, delayed or nonhealing, painful or ugly scar, numbness or sensation changes, over/under correction, recurrence, transfer lesions, further deformity, hardware failure, DVT/PE, loss of toe/foot. Patient understands these risks and wishes to proceed with surgery. The surgical consent was reviewed with the patient all 3 pages were signed. No promises or guarantees were given to the outcome of the procedure. All questions were answered to the best of my ability. Before the surgery the patient was encouraged to call the office if there is any further questions. [facility].  Vivi BarrackMatthew R Wagoner DPM

## 2017-08-24 NOTE — Patient Instructions (Addendum)
Pre-Operative Instructions  Congratulations, you have decided to take an important step to improving your quality of life.  You can be assured that the doctors of [facility] will be with you every step of the way.  1. Plan to be at the surgery center/hospital at least 1 (one) hour prior to your scheduled time unless otherwise directed by the surgical center/hospital staff.  You must have a responsible adult accompany you, remain during the surgery and drive you home.  Make sure you have directions to the surgical center/hospital and know how to get there on time. 2. For hospital based surgery you will need to obtain a history and physical form from your family physician within 1 month prior to the date of surgery- we will give you a form for you primary physician.  3. We make every effort to accommodate the date you request for surgery.  There are however, times where surgery dates or times have to be moved.  We will contact you as soon as possible if a change in schedule is required.   4. No Aspirin/Ibuprofen for one week before surgery.  If you are on aspirin, any non-steroidal anti-inflammatory medications (Mobic, Aleve, Ibuprofen) you should stop taking it 7 days prior to your surgery.  You make take Tylenol  For pain prior to surgery.  5. Medications- If you are taking daily heart and blood pressure medications, seizure, reflux, allergy, asthma, anxiety, pain or diabetes medications, make sure the surgery center/hospital is aware before the day of surgery so they may notify you which medications to take or avoid the day of surgery. 6. No food or drink after midnight the night before surgery unless directed otherwise by surgical center/hospital staff. 7. No alcoholic beverages 24 hours prior to surgery.  No smoking 24 hours prior to or 24 hours after surgery. 8. Wear loose pants or shorts- loose enough to fit over bandages, boots, and casts. 9. No slip on shoes, sneakers are best. 10. Bring  your boot with you to the surgery center/hospital.  Also bring crutches or a walker if your physician has prescribed it for you.  If you do not have this equipment, it will be provided for you after surgery. 11. If you have not been contracted by the surgery center/hospital by the day before your surgery, call to confirm the date and time of your surgery. 12. Leave-time from work may vary depending on the type of surgery you have.  Appropriate arrangements should be made prior to surgery with your employer. 13. Prescriptions will be provided immediately following surgery by your doctor.  Have these filled as soon as possible after surgery and take the medication as directed. 14. Remove nail polish on the operative foot. 15. Wash the night before surgery.  The night before surgery wash the foot and leg well with the antibacterial soap provided and water paying special attention to beneath the toenails and in between the toes.  Rinse thoroughly with water and dry well with a towel.  Perform this wash unless told not to do so by your physician.  Enclosed: 1 Ice pack (please put in freezer the night before surgery)   1 Hibiclens skin cleaner   Pre-op Instructions  If you have any questions regarding the instructions, do not hesitate to call our office at any point during this process.   [place]: [address] [place], [place] [address] [phone]  [place]: [address] [place], [place] [address] [phone]  Dr. Matthew Wagoner, DPM     Bunion A bunion is a bump on the base of the big toe that forms when the bones of the big toe joint move out of position. Bunions may be small at first, but they often get larger over time. The can make walking painful. What are the causes? A bunion may be caused by:  Wearing narrow or pointed shoes that force the big toe to press against the other toes.  Abnormal foot development that causes the foot to roll inward (pronate).  Changes in the foot that  are caused by certain diseases, such as rheumatoid arthritis and polio.  A foot injury.  What increases the risk? The following factors may make you more likely to develop this condition:  Wearing shoes that squeeze the toes together.  Having certain diseases, such as: ? Rheumatoid arthritis. ? Polio. ? Cerebral palsy.  Having family members who have bunions.  Being born with a foot deformity, such as flat feet or low arches.  Doing activities that put a lot of pressure on the feet, such as ballet dancing.  What are the signs or symptoms? The main symptom of a bunion is a noticeable bump on the big toe. Other symptoms may include:  Pain.  Swelling around the big toe.  Redness and inflammation.  Thick or hardened skin on the big toe or between the toes.  Stiffness or loss of motion in the big toe.  Trouble with walking.  How is this diagnosed? A bunion may be diagnosed based on your symptoms, medical history, and activities. You may have tests, such as:  X-rays. These allow your health care provider to check the position of the bones in your foot and look for damage to your joint. They also help your health care provider to determine the severity of your bunion and the best way to treat it.  Joint aspiration. In this test, a sample of fluid is removed from the toe joint. This test, which may be done if you are in a lot of pain, helps to rule out diseases that cause painful swelling of the joints, such as arthritis.  How is this treated? There is no cure for a bunion, but treatment can help to prevent a bunion from getting worse. Treatment depends on the severity of your symptoms. Your health care provider may recommend:  Wearing shoes that have a wide toe box.  Using bunion pads to cushion the affected area.  Taping your toes together to keep them in a normal position.  Placing a device inside your shoe (orthotics) to help reduce pressure on your toe joint.  Taking  medicine to ease pain, inflammation, and swelling.  Applying heat or ice to the affected area.  Doing stretching exercises.  Surgery to remove scar tissue and move the toes back into their normal position. This treatment is rare.  Follow these instructions at home:  Support your toe joint with proper footwear, shoe padding, or taping as told by your health care provider.  Take over-the-counter and prescription medicines only as told by your health care provider.  If directed, apply ice to the injured area: ? Put ice in a plastic bag. ? Place a towel between your skin and the bag. ? Leave the ice on for 20 minutes, 2-3 times per day.  If directed, apply heat to the affected area before you exercise. Use the heat source that your health care provider recommends, such as a moist heat pack or a heating pad. ? Place a towel between   your skin and the heat source. ? Leave the heat on for 20-30 minutes. ? Remove the heat if your skin turns bright red. This is especially important if you are unable to feel pain, heat, or cold. You may have a greater risk of getting burned.  Do exercises as told by your health care provider.  Keep all follow-up visits as told by your health care provider. Contact a health care provider if:  Your symptoms get worse.  Your symptoms do not improve in 2 weeks. Get help right away if:  You have severe pain and trouble with walking. This information is not intended to replace advice given to you by your health care provider. Make sure you discuss any questions you have with your health care provider. Document Released: 08/24/2005 Document Revised: 01/30/2016 Document Reviewed: 03/24/2015 Elsevier Interactive Patient Education  2018 [employer]  

## 2017-08-25 DIAGNOSIS — M21619 Bunion of unspecified foot: Secondary | ICD-10-CM | POA: Insufficient documentation

## 2018-01-20 NOTE — Progress Notes (Signed)
This encounter was created in error - please disregard.

## 2018-03-04 ENCOUNTER — Ambulatory Visit: Payer: BLUE CROSS/BLUE SHIELD

## 2018-03-04 ENCOUNTER — Encounter: Payer: Self-pay | Admitting: Podiatry

## 2018-03-04 ENCOUNTER — Ambulatory Visit (INDEPENDENT_AMBULATORY_CARE_PROVIDER_SITE_OTHER): Payer: BLUE CROSS/BLUE SHIELD

## 2018-03-04 ENCOUNTER — Ambulatory Visit (INDEPENDENT_AMBULATORY_CARE_PROVIDER_SITE_OTHER): Payer: BLUE CROSS/BLUE SHIELD | Admitting: Podiatry

## 2018-03-04 DIAGNOSIS — M21612 Bunion of left foot: Secondary | ICD-10-CM

## 2018-03-04 DIAGNOSIS — M21611 Bunion of right foot: Secondary | ICD-10-CM

## 2018-03-04 NOTE — Patient Instructions (Signed)

## 2018-03-05 NOTE — Progress Notes (Signed)
Subjective: 27 year old male presents the office today for surgical consultation due to a painful bunion deformity in the left foot.  Since I last saw him he states the pain is been getting worse he has tried changing shoes and padding without any significant improvement.  He states that it hurts on a daily basis especially with playing basketball and wearing shoes.  He denies any recent injury he has no other concerns. Denies any systemic complaints such as fevers, chills, nausea, vomiting. No acute changes since last appointment, and no other complaints at this time.   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  There is no pain or crepitation with the patient range of motion the majority of tenderness is all localized to the medial.  No significant first ray hypomobility is present at this time.  There is no other area tenderness identified.  There is slight erythema on the medial first metatarsal head from where the bunion has been rubbing inside shoes but there is no skin breakdown or signs of infection.  No open lesions or pre-ulcerative lesions.  No pain with calf compression, swelling, warmth, erythema  Assessment: Symptomatic right bunion deformity  Plan: -All treatment options discussed with the patient including all alternatives, risks, complications.  -X-rays were obtained and reviewed.  Moderate HAV is present.  No evidence of acute fracture. -We discussed both conservative as well as surgical treatment options.  At this time he wishes to proceed with surgical intervention. -We discussed [place] bunionectomy with screw fixation and he wishes to proceed.  We discussed the surgery as well as postoperative course and this is not a guarantee resolution of symptoms. -The incision placement as well as the postoperative course was discussed with the patient. I discussed risks of the surgery which include, but not  limited to, infection, bleeding, pain, swelling, need for further surgery, delayed or nonhealing, painful or ugly scar, numbness or sensation changes, over/under correction, recurrence, transfer lesions, further deformity, hardware failure, DVT/PE, loss of toe/foot. Patient understands these risks and wishes to proceed with surgery. The surgical consent was reviewed with the patient all 3 pages were signed. No promises or guarantees were given to the outcome of the procedure. All questions were answered to the best of my ability. Before the surgery the patient was encouraged to call the office if there is any further questions. [facility] basis. -Cam boot dispensed for postoperative use -Patient encouraged to call the office with any questions, concerns, change in symptoms.   Vivi BarrackMatthew R Wagoner DPM

## 2018-04-11 ENCOUNTER — Telehealth: Payer: Self-pay | Admitting: *Deleted

## 2018-04-11 ENCOUNTER — Encounter: Payer: BLUE CROSS/BLUE SHIELD | Admitting: Podiatry

## 2018-04-11 NOTE — Telephone Encounter (Signed)
Called and left a message for the patient to call me-I was calling to check on the patient after surgery with Dr Ardelle AntonWagoner on Wednesday. Misty StanleyLisa

## 2018-04-18 ENCOUNTER — Encounter: Payer: BLUE CROSS/BLUE SHIELD | Admitting: Podiatry

## 2020-04-20 ENCOUNTER — Other Ambulatory Visit: Payer: Self-pay

## 2020-04-20 ENCOUNTER — Encounter (HOSPITAL_COMMUNITY): Payer: Self-pay

## 2020-04-20 ENCOUNTER — Ambulatory Visit (HOSPITAL_COMMUNITY)
Admission: EM | Admit: 2020-04-20 | Discharge: 2020-04-20 | Disposition: A | Discharge status: Home / Self Care | Payer: HRSA Program | Attending: Urgent Care | Admitting: Urgent Care

## 2020-04-20 DIAGNOSIS — R42 Dizziness and giddiness: Secondary | ICD-10-CM | POA: Diagnosis not present

## 2020-04-20 DIAGNOSIS — R519 Headache, unspecified: Secondary | ICD-10-CM

## 2020-04-20 DIAGNOSIS — R05 Cough: Secondary | ICD-10-CM | POA: Diagnosis present

## 2020-04-20 DIAGNOSIS — R059 Cough, unspecified: Secondary | ICD-10-CM

## 2020-04-20 DIAGNOSIS — J4530 Mild persistent asthma, uncomplicated: Secondary | ICD-10-CM | POA: Insufficient documentation

## 2020-04-20 DIAGNOSIS — R52 Pain, unspecified: Secondary | ICD-10-CM

## 2020-04-20 DIAGNOSIS — R5381 Other malaise: Secondary | ICD-10-CM | POA: Diagnosis not present

## 2020-04-20 DIAGNOSIS — U071 COVID-19: Secondary | ICD-10-CM | POA: Insufficient documentation

## 2020-04-20 DIAGNOSIS — B349 Viral infection, unspecified: Secondary | ICD-10-CM | POA: Insufficient documentation

## 2020-04-20 MED ORDER — PROMETHAZINE-DM 6.25-15 MG/5ML PO SYRP: 5.0000 mL | ORAL_SOLUTION | Freq: Every evening | ORAL | 0 refills | Status: AC | PRN

## 2020-04-20 MED ORDER — BENZONATATE 100 MG PO CAPS: 100.0000 mg | ORAL_CAPSULE | Freq: Three times a day (TID) | ORAL | 0 refills | Status: AC | PRN

## 2020-04-20 MED ORDER — PSEUDOEPHEDRINE HCL 30 MG PO TABS: 30.0000 mg | ORAL_TABLET | Freq: Three times a day (TID) | ORAL | 0 refills | Status: AC | PRN

## 2020-04-20 MED ORDER — ALBUTEROL SULFATE HFA 108 (90 BASE) MCG/ACT IN AERS: 1.0000 | INHALATION_SPRAY | Freq: Four times a day (QID) | RESPIRATORY_TRACT | 0 refills | Status: AC | PRN

## 2020-04-20 MED ORDER — CETIRIZINE HCL 10 MG PO TABS: 10.0000 mg | ORAL_TABLET | Freq: Every day | ORAL | 0 refills | Status: AC

## 2020-04-20 NOTE — ED Notes (Signed)
I accessed patient's chart : Patient was discharged prior to obtaining the orders for Covid specimen

## 2020-04-20 NOTE — ED Triage Notes (Signed)
Pt c/o dizziness and HAx3 days. Pt denies any other symptoms.

## 2020-04-20 NOTE — Discharge Instructions (Addendum)

## 2020-04-20 NOTE — ED Provider Notes (Addendum)
[facility]   MRN: [id number] DOB: 1991-05-13  Subjective:   Hector Mccann is a 29 y.o. male presenting for 3-day history of acute onset headaches, transient intermittent dizziness, malaise, coughing, body aches, loss of sense of taste and smell.  Patient has not had COVID-19, has not had vaccination either.  He states that last year around this time he had similar symptoms that, was diagnosed with vertigo.  He does have a history of asthma.  Denies smoking cigarettes.  Has not a COVID-19, has not had Covid vaccination.  No current facility-administered medications for this encounter.  Current Outpatient Medications:  .  albuterol (PROVENTIL HFA;VENTOLIN HFA) 108 (90 BASE) MCG/ACT inhaler, Inhale 1-2 puffs into the lungs every 6 (six) hours as needed for wheezing or shortness of breath., Disp: 1 Inhaler, Rfl: 0   No Known Allergies  Past Medical History:  Diagnosis Date  . Asthma      History reviewed. No pertinent surgical history.  No family history on file.  Social History   Tobacco Use  . Smoking status: Never Smoker  . Smokeless tobacco: Never Used  Substance Use Topics  . Alcohol use: No  . Drug use: Never    ROS   Objective:   Vitals: BP 140/78   Pulse 65   Temp 97.7 F (36.5 C) (Oral)   Resp 16   Ht 5\' 10"  (1.778 m)   Wt 145 lb (65.8 kg)   SpO2 100%   BMI 20.81 kg/m   Physical Exam Constitutional:      General: He is not in acute distress.    Appearance: Normal appearance. He is well-developed and normal weight. He is not ill-appearing, toxic-appearing or diaphoretic.  HENT:     Head: Normocephalic and atraumatic.     Right Ear: Tympanic membrane, ear canal and external ear normal. There is no impacted cerumen.     Left Ear: Tympanic membrane, ear canal and external ear normal. There is no impacted cerumen.     Nose: Nose normal. No congestion or rhinorrhea.     Mouth/Throat:     Mouth: Mucous membranes are moist.     Pharynx: No  oropharyngeal exudate or posterior oropharyngeal erythema.  Eyes:     General: No scleral icterus.       Right eye: No discharge.        Left eye: No discharge.     Extraocular Movements: Extraocular movements intact.     Conjunctiva/sclera: Conjunctivae normal.     Pupils: Pupils are equal, round, and reactive to light.  Cardiovascular:     Rate and Rhythm: Normal rate and regular rhythm.     Heart sounds: Normal heart sounds. No murmur heard.  No friction rub. No gallop.   Pulmonary:     Effort: Pulmonary effort is normal. No respiratory distress.     Breath sounds: Normal breath sounds. No stridor. No wheezing, rhonchi or rales.  Musculoskeletal:     Cervical back: Normal range of motion and neck supple. No rigidity. No muscular tenderness.  Neurological:     General: No focal deficit present.     Mental Status: He is alert and oriented to person, place, and time.     Cranial Nerves: No cranial nerve deficit.     Motor: No weakness.     Coordination: Coordination normal.     Gait: Gait normal.     Deep Tendon Reflexes: Reflexes normal.  Psychiatric:        Mood and  Affect: Mood normal.        Behavior: Behavior normal.        Thought Content: Thought content normal.        Judgment: Judgment normal.      Assessment and Plan :   PDMP not reviewed this encounter.  1. Generalized headaches   2. Dizziness   3. Cough   4. Body aches   5. Malaise   6. Mild persistent asthma without complication   7. Viral syndrome      Will manage for viral illness such as viral URI, viral syndrome, viral rhinitis, COVID-19. Counseled patient on nature of COVID-19 including modes of transmission, diagnostic testing, management and supportive care.  Offered scripts for symptomatic relief. COVID 19 testing is pending. Counseled patient on potential for adverse effects with medications prescribed/recommended today, ER and return-to-clinic precautions discussed, patient verbalized  understanding.     Wallis Bamberg, PA-C 04/20/20 1306

## 2020-04-21 LAB — SARS CORONAVIRUS 2 (TAT 6-24 HRS): SARS Coronavirus 2: POSITIVE — AB

## 2020-04-22 ENCOUNTER — Telehealth: Payer: Self-pay | Admitting: Adult Health

## 2020-04-22 NOTE — Telephone Encounter (Signed)
Called and unable to [facility] regarding monoclonal antibody treatment for COVID 19 given to those who are at risk for complications and/or hospitalization of the virus.  Patient meets criteria based on: chronic lung disease  Call back number given: [phone]  My chart message: sent  Lillard Anes, NP
